# Patient Record
Sex: Male | Born: 1935 | Race: White | Hispanic: No | Marital: Married | Smoking: Former smoker
Health system: Southern US, Community
[De-identification: ages and names within clinical notes are randomized; demographics above are authoritative.]

## PROBLEM LIST (undated history)

## (undated) DIAGNOSIS — I10 Essential (primary) hypertension: Secondary | ICD-10-CM

---

## 2007-08-18 ENCOUNTER — Ambulatory Visit: Payer: Self-pay | Admitting: Gastroenterology

## 2011-10-21 ENCOUNTER — Ambulatory Visit: Payer: Self-pay | Admitting: Gastroenterology

## 2012-11-25 ENCOUNTER — Ambulatory Visit: Payer: Self-pay | Admitting: Orthopedic Surgery

## 2014-04-18 DIAGNOSIS — I10 Essential (primary) hypertension: Secondary | ICD-10-CM | POA: Insufficient documentation

## 2014-04-18 DIAGNOSIS — M5090 Cervical disc disorder, unspecified, unspecified cervical region: Secondary | ICD-10-CM | POA: Insufficient documentation

## 2014-04-18 DIAGNOSIS — E782 Mixed hyperlipidemia: Secondary | ICD-10-CM | POA: Insufficient documentation

## 2014-10-14 ENCOUNTER — Other Ambulatory Visit: Payer: Self-pay | Admitting: Orthopedic Surgery

## 2015-02-07 ENCOUNTER — Emergency Department
Admission: EM | Admit: 2015-02-07 | Discharge: 2015-02-07 | Disposition: A | Discharge status: Home / Self Care | Payer: Medicare Other | Attending: Emergency Medicine | Admitting: Emergency Medicine

## 2015-02-07 ENCOUNTER — Emergency Department: Payer: Medicare Other

## 2015-02-07 ENCOUNTER — Encounter: Payer: Self-pay | Admitting: Emergency Medicine

## 2015-02-07 DIAGNOSIS — I10 Essential (primary) hypertension: Secondary | ICD-10-CM | POA: Diagnosis not present

## 2015-02-07 DIAGNOSIS — R270 Ataxia, unspecified: Secondary | ICD-10-CM | POA: Diagnosis not present

## 2015-02-07 DIAGNOSIS — R531 Weakness: Secondary | ICD-10-CM | POA: Insufficient documentation

## 2015-02-07 DIAGNOSIS — R42 Dizziness and giddiness: Secondary | ICD-10-CM | POA: Diagnosis present

## 2015-02-07 HISTORY — DX: Essential (primary) hypertension: I10

## 2015-02-07 LAB — URINALYSIS COMPLETE WITH MICROSCOPIC (ARMC ONLY)
BACTERIA UA: NONE SEEN
BILIRUBIN URINE: NEGATIVE
GLUCOSE, UA: NEGATIVE mg/dL
HGB URINE DIPSTICK: NEGATIVE
Ketones, ur: NEGATIVE mg/dL
Leukocytes, UA: NEGATIVE
Nitrite: NEGATIVE
Protein, ur: NEGATIVE mg/dL
Specific Gravity, Urine: 1.009 (ref 1.005–1.030)
pH: 6 (ref 5.0–8.0)

## 2015-02-07 LAB — BASIC METABOLIC PANEL
ANION GAP: 9 (ref 5–15)
BUN: 14 mg/dL (ref 6–20)
CO2: 32 mmol/L (ref 22–32)
CREATININE: 1.23 mg/dL (ref 0.61–1.24)
Calcium: 10 mg/dL (ref 8.9–10.3)
Chloride: 89 mmol/L — ABNORMAL LOW (ref 101–111)
GFR calc Af Amer: >60 mL/min (ref >60)
GFR, EST NON AFRICAN AMERICAN: 54 mL/min — AB (ref >60)
GLUCOSE: 97 mg/dL (ref 65–99)
Potassium: 4.5 mmol/L (ref 3.5–5.1)
Sodium: 130 mmol/L — ABNORMAL LOW (ref 135–145)

## 2015-02-07 LAB — CBC
HEMATOCRIT: 46.1 % (ref 40.0–52.0)
Hemoglobin: 15.5 g/dL (ref 13.0–18.0)
MCH: 29.7 pg (ref 26.0–34.0)
MCHC: 33.7 g/dL (ref 32.0–36.0)
MCV: 88.1 fL (ref 80.0–100.0)
PLATELETS: 235 10*3/uL (ref 150–440)
RBC: 5.23 MIL/uL (ref 4.40–5.90)
RDW: 12.8 % (ref 11.5–14.5)
WBC: 6.5 10*3/uL (ref 3.8–10.6)

## 2015-02-07 MED ORDER — SODIUM CHLORIDE 0.9 % IV BOLUS (SEPSIS)
500.0000 mL | Freq: Once | INTRAVENOUS | Status: AC
Start: 2015-02-07 — End: 2015-02-07
  Administered 2015-02-07: 500 mL via INTRAVENOUS

## 2015-02-07 MED ORDER — IOHEXOL 350 MG/ML SOLN
80.0000 mL | Freq: Once | INTRAVENOUS | Status: AC | PRN
  Administered 2015-02-07: 80 mL via INTRAVENOUS

## 2015-02-07 MED ORDER — MECLIZINE HCL 25 MG PO TABS
50.0000 mg | ORAL_TABLET | Freq: Once | ORAL | Status: AC
  Administered 2015-02-07: 50 mg via ORAL
  Filled 2015-02-07: qty 2

## 2015-02-07 MED ORDER — MECLIZINE HCL 25 MG PO TABS: 25.0000 mg | ORAL_TABLET | Freq: Three times a day (TID) | ORAL | Status: DC | PRN

## 2015-02-07 NOTE — ED Notes (Signed)
Pt was able to ambulate with one stand by assist. Pt reports feeling weak but reports that dizziness has decreased. MD at bedside to observe pts ambulation. No acute distress noted and pt verbalized wanting to try meclizine at home. Pt verbalizes understanding of follow-up instructions.

## 2015-02-07 NOTE — ED Notes (Signed)
MD and RN at bedside to explain treatment plan to pt and pts wife. Pt verbalized understanding of plan.

## 2015-02-07 NOTE — ED Provider Notes (Signed)
[facility] Emergency Department Provider Note  ____________________________________________  Time seen: Approximately 12:05 PM  I have reviewed the triage vital signs and the nursing notes.   HISTORY  Chief Complaint Dizziness and Weakness    HPI Joel Sanders is a 81 y.o. male with a history of hypertension and hypercholesterolemia who is presenting today with diffuse weakness and ataxia. The patient says the symptoms started abruptly this past Thursday evening. He said that prior in the day he had blowing leaves and played golf. Since thenhe has been having worsening trouble with walking. Denies any focal weakness. Says that when he stands he has a funny feeling in his head but not saying that the room feels like it is spinning. He also says that he did have a flu shot this past Monday, 8 days ago. He denies a history of stroke but says that his parents have had strokes in the past. Denies weakness in the legs anymore so than the arms.   History reviewed. No pertinent past medical history.  There are no active problems to display for this patient.   History reviewed. No pertinent past surgical history.  No current outpatient prescriptions on file.  Allergies Review of patient's allergies indicates not on file.  No family history on file.  Social History Social History  Substance Use Topics  . Smoking status: Never Smoker   . Smokeless tobacco: None  . Alcohol Use: No    Review of Systems Constitutional: No fever/chills Eyes: No visual changes. ENT: No sore throat. Cardiovascular: Denies chest pain. Respiratory: Denies shortness of breath. Gastrointestinal: No abdominal pain.  No nausea, no vomiting.  No diarrhea.  No constipation. Genitourinary: Negative for dysuria. Musculoskeletal: Negative for back pain. Skin: Negative for rash. Neurological: Negative for headaches, focal weakness or numbness.  10-point ROS otherwise  negative.  ____________________________________________   PHYSICAL EXAM:  VITAL SIGNS: ED Triage Vitals  Enc Vitals Group     BP 02/07/15 1019 156/79 mmHg     Pulse Rate 02/07/15 1019 70     Resp 02/07/15 1019 16     Temp 02/07/15 1019 97.5 F (36.4 C)     Temp src --      SpO2 02/07/15 1019 97 %     Weight 02/07/15 1019 167 lb (75.751 kg)     Height 02/07/15 1019 5\' 10"  (1.778 m)     Head Cir --      Peak Flow --      Pain Score --      Pain Loc --      Pain Edu? --      Excl. in GC? --     Constitutional: Alert and oriented. Well appearing and in no acute distress. Eyes: Conjunctivae are normal. PERRL. EOMI, but with right going nystagmus when looks right. Head: Atraumatic. Nose: No congestion/rhinnorhea. Mouth/Throat: Mucous membranes are moist.  Oropharynx non-erythematous. Neck: No stridor.   Cardiovascular: Normal rate, regular rhythm. Grossly normal heart sounds.  Good peripheral circulation. Respiratory: Normal respiratory effort.  No retractions. Lungs CTAB. Gastrointestinal: Soft and nontender. No distention. No abdominal bruits. No CVA tenderness. Musculoskeletal: No lower extremity tenderness nor edema.  No joint effusions. Neurologic:  Normal speech and language. No gross focal neurologic deficits are appreciated. Patient with difficulty walking without assistance. Is able to take several steps but with a shuffling gait.  Mild right upper extremity ataxia on finger to nose testing.. Normal heel to shin testing. 2+ reflexes at the patellar tendons. Skin:  Skin is warm, dry and intact. No rash noted. Psychiatric: Mood and affect are normal. Speech and behavior are normal.  NIH Stroke Scale   1210 Person Administering Scale: Arelia Longest  Administer stroke scale items in the order listed. Record performance in each category after each subscale exam. Do not go back and change scores. Follow directions provided for each exam technique. Scores should  reflect what the patient does, not what the clinician thinks the patient can do. The clinician should record answers while administering the exam and work quickly. Except where indicated, the patient should not be coached (i.e., repeated requests to patient to make a special effort).   1a  Level of consciousness: 0=alert; keenly responsive  1b. LOC questions:  0=Performs both tasks correctly  1c. LOC commands: 0=Performs both tasks correctly  2.  Best Gaze: 0=normal  3.  Visual: 0=No visual loss  4. Facial Palsy: 0=Normal symmetric movement  5a.  Motor left arm: 0=No drift, limb holds 90 (or 45) degrees for full 10 seconds  5b.  Motor right arm: 0=No drift, limb holds 90 (or 45) degrees for full 10 seconds  6a. motor left leg: 0=No drift, limb holds 90 (or 45) degrees for full 10 seconds  6b  Motor right leg:  0=No drift, limb holds 90 (or 45) degrees for full 10 seconds  7. Limb Ataxia: 1=Present in one limb  8.  Sensory: 0=Normal; no sensory loss  9. Best Language:  0=No aphasia, normal  10. Dysarthria: 0=Normal  11. Extinction and Inattention: 0=No abnormality  12. Distal motor function: 0=Normal   Total:   1   ____________________________________________   LABS (all labs ordered are listed, but only abnormal results are displayed)  Labs Reviewed  BASIC METABOLIC PANEL - Abnormal; Notable for the following:    Sodium 130 (*)    Chloride 89 (*)    GFR calc non Af Amer 54 (*)    All other components within normal limits  URINALYSIS COMPLETEWITH MICROSCOPIC ([facility] ONLY) - Abnormal; Notable for the following:    Color, Urine YELLOW (*)    APPearance CLEAR (*)    Squamous Epithelial / LPF 0-5 (*)    All other components within normal limits  CBC  CBG MONITORING, ED   ____________________________________________  EKG  ED ECG REPORT I, Arelia Longest, the attending physician, personally viewed and interpreted this ECG.   Date: 02/07/2015  EKG Time: 1023  Rate: 71   Rhythm: normal EKG, normal sinus rhythm  Axis: Normal axis  Intervals:none  ST&T Change: No ST segment elevation or depression. No abnormal T-wave inversion.  ____________________________________________  RADIOLOGY  CAT scan with and without contrast of the head and neck without any evidence of acute intracranial abnormality. 45% proximal right and 55% proximal left ICA stenosis. ____________________________________________   PROCEDURES    ____________________________________________   INITIAL IMPRESSION / ASSESSMENT AND PLAN / ED COURSE  Pertinent labs & imaging results that were available during my care of the patient were reviewed by me and considered in my medical decision making (see chart for details).  ----------------------------------------- 4:15 PM on 02/07/2015 ----------------------------------------- Discussed case Dr. Loretha Brasil after the CT came back negative. Says that the nystagmus makes the case sound like a peripheral issue. We'll try meclizine.  ----------------------------------------- 5:35 PM on 02/07/2015 -----------------------------------------  Patient is resting comfortably at this time. After the meclizine is able to ambulate with a normal gait without any assistance. He says he still feels weak but  much improved. I discussed with him that because he has not improved that we could consider further workup including a lumbar puncture for Guillain-Barr syndrome. However, the patient says that he would like try the meclizine at home and will follow with Dr. Hyacinth Meeker in the office. I discussed extensively with him and his wife the return precautions to return to the emergency department for any worsening or concerning symptoms. Less likely this is caused by stroke, but not completely ruled out at this time. The patient is widowed understand this and are willing to comply with the plan.  ____________________________________________   FINAL CLINICAL  IMPRESSION(S) / ED DIAGNOSES  Final diagnoses:  Ataxia  Ataxia   Generalized weakness.   Myrna Blazer, MD 02/07/15 [phone]

## 2015-02-07 NOTE — ED Notes (Signed)
[facility] due to symptoms of weakness and dizziness. Pt was out of town last Thursday when pt developed symptoms of dizziness and weakness. Pt checked BP and reading was high. Pt contacted PCP at Dr. Garen LahMillers office and was advised to up the dosage. Today pt reports that the symptoms have not subsided.

## 2015-02-07 NOTE — ED Notes (Signed)
Pt states that he remembers on Friday evening when he woke up and turned on the TV, he noticed his vision was blurry and it took about a minute for the TV to focus for him. Pt denies blurred vision today.

## 2016-06-25 ENCOUNTER — Emergency Department
Admission: EM | Admit: 2016-06-25 | Discharge: 2016-06-25 | Disposition: A | Discharge status: Home / Self Care | Payer: Medicare Other | Attending: Emergency Medicine | Admitting: Emergency Medicine

## 2016-06-25 ENCOUNTER — Encounter: Payer: Self-pay | Admitting: Medical Oncology

## 2016-06-25 ENCOUNTER — Emergency Department: Payer: Medicare Other

## 2016-06-25 DIAGNOSIS — Y939 Activity, unspecified: Secondary | ICD-10-CM | POA: Diagnosis not present

## 2016-06-25 DIAGNOSIS — Y999 Unspecified external cause status: Secondary | ICD-10-CM | POA: Diagnosis not present

## 2016-06-25 DIAGNOSIS — I10 Essential (primary) hypertension: Secondary | ICD-10-CM | POA: Insufficient documentation

## 2016-06-25 DIAGNOSIS — W0110XA Fall on same level from slipping, tripping and stumbling with subsequent striking against unspecified object, initial encounter: Secondary | ICD-10-CM | POA: Diagnosis not present

## 2016-06-25 DIAGNOSIS — S0101XA Laceration without foreign body of scalp, initial encounter: Secondary | ICD-10-CM | POA: Insufficient documentation

## 2016-06-25 DIAGNOSIS — Z79899 Other long term (current) drug therapy: Secondary | ICD-10-CM | POA: Insufficient documentation

## 2016-06-25 DIAGNOSIS — Z23 Encounter for immunization: Secondary | ICD-10-CM | POA: Insufficient documentation

## 2016-06-25 DIAGNOSIS — S0990XA Unspecified injury of head, initial encounter: Secondary | ICD-10-CM | POA: Diagnosis present

## 2016-06-25 DIAGNOSIS — Y929 Unspecified place or not applicable: Secondary | ICD-10-CM | POA: Diagnosis not present

## 2016-06-25 DIAGNOSIS — T148XXA Other injury of unspecified body region, initial encounter: Secondary | ICD-10-CM

## 2016-06-25 MED ORDER — TETANUS-DIPHTH-ACELL PERTUSSIS 5-2.5-18.5 LF-MCG/0.5 IM SUSP
0.5000 mL | Freq: Once | INTRAMUSCULAR | Status: AC
  Administered 2016-06-25: 0.5 mL via INTRAMUSCULAR

## 2016-06-25 MED ORDER — TETANUS-DIPHTH-ACELL PERTUSSIS 5-2.5-18.5 LF-MCG/0.5 IM SUSP
INTRAMUSCULAR | Status: AC
  Administered 2016-06-25: 0.5 mL via INTRAMUSCULAR
  Filled 2016-06-25: qty 0.5

## 2016-06-25 NOTE — ED Provider Notes (Signed)
[facility] Emergency Department Provider Note  ____________________________________________   First MD Initiated Contact with Patient 06/25/16 [phone]     (approximate)  I have reviewed the triage vital signs and the nursing notes.   HISTORY  Chief Complaint Fall and Head Injury    HPI Joel Sanders is a 83 y.o. male who comes to the emergency department roughly 1 hour after mechanical slip and fall on the ice this morning falling backwards and striking the back of his head. He does report loss of consciousness. His wife said that for about 20 minutes he was acting abnormally. No seizure-like activity. Now is behaving normally. No numbness or weakness. He takes a baby aspirin a day but no other blood thinning medication. No chest pain or shortness of breath. No abdominal pain nausea or vomiting. He has an unknown last tetanus.He is currently in minimal pain and declines pain medication.   Past Medical History:  Diagnosis Date  . Hypertension     There are no active problems to display for this patient.   History reviewed. No pertinent surgical history.  Prior to Admission medications   Medication Sig Start Date End Date Taking? Authorizing Provider  lisinopril-hydrochlorothiazide (PRINZIDE,ZESTORETIC) 10-12.5 MG tablet Take 1 tablet by mouth daily.     Historical Provider, MD  meclizine (ANTIVERT) 25 MG tablet Take 1 tablet (25 mg total) by mouth 3 (three) times daily as needed for dizziness. 02/07/15   Myrna Blazer, MD  naproxen sodium (ANAPROX) 220 MG tablet Take 220 mg by mouth 2 (two) times daily as needed (for pain).    Historical Provider, MD  rosuvastatin (CRESTOR) 5 MG tablet Take 2.5 mg by mouth at bedtime.    Historical Provider, MD    Allergies Patient has no known allergies.  No family history on file.  Social History Social History  Substance Use Topics  . Smoking status: Never Smoker  . Smokeless tobacco: Not on file    . Alcohol use No    Review of Systems Constitutional: No fever/chills Eyes: No visual changes. ENT: No sore throat. Cardiovascular: Denies chest pain. Respiratory: Denies shortness of breath. Gastrointestinal: No abdominal pain.  No nausea, no vomiting.  No diarrhea.  No constipation. Genitourinary: Negative for dysuria. Musculoskeletal: Negative for back pain. Skin: Positive for wound Neurological: Positive for headaches, negative focal weakness or numbness.  10-point ROS otherwise negative.  ____________________________________________   PHYSICAL EXAM:  VITAL SIGNS: ED Triage Vitals  Enc Vitals Group     BP 06/25/16 0832 (!) 183/95     Pulse Rate 06/25/16 0832 81     Resp 06/25/16 0832 16     Temp 06/25/16 0834 97.7 F (36.5 C)     Temp Source 06/25/16 0834 Oral     SpO2 06/25/16 0832 97 %     Weight 06/25/16 0833 165 lb (74.8 kg)     Height 06/25/16 0833 5\' 10"  (1.778 m)     Head Circumference --      Peak Flow --      Pain Score --      Pain Loc --      Pain Edu? --      Excl. in GC? --     Constitutional: Alert and oriented x 4 well appearing nontoxic no diaphoresis speaks in full, clear sentences Eyes: PERRL EOMI. Head: Abrasion and skin tear to his occiput with no active bleeding. Nose: No congestion/rhinnorhea. Mouth/Throat: No trismus Neck: No stridor.   Cardiovascular: Normal  rate, regular rhythm. Grossly normal heart sounds.  Good peripheral circulation. Respiratory: Normal respiratory effort.  No retractions. Lungs CTAB and moving good air Gastrointestinal: Soft nondistended nontender no rebound no guarding no peritonitis no McBurney's tenderness negative Rovsing's no costovertebral tenderness negative Murphy's Musculoskeletal: No lower extremity edema   Neurologic:  Normal speech and language. No gross focal neurologic deficits are appreciated. Skin:  Skin is warm, dry and intact. No rash noted. Psychiatric: Mood and affect are normal. Speech and  behavior are normal.    ____________________________________________   DIFFERENTIAL  Subdural hematoma, intracerebral hemorrhage, laceration ____________________________________________   LABS (all labs ordered are listed, but only abnormal results are displayed)  Labs Reviewed - No data to display   __________________________________________  EKG   ____________________________________________  RADIOLOGY  CT negative for intracerebral hemorrhage ____________________________________________   PROCEDURES  Procedure(s) performed: no  Procedures  Critical Care performed: no  ____________________________________________   INITIAL IMPRESSION / ASSESSMENT AND PLAN / ED COURSE  Pertinent labs & imaging results that were available during my care of the patient were reviewed by me and considered in my medical decision making (see chart for details).  CT scan done given history of aspirin, significant fall, and advanced age. Fortunately the imaging is negative for acute pathology. Wound was washed out and dressed with Steri-Strips. He is medically stable for outpatient management understands and agrees the plan.      ____________________________________________   FINAL CLINICAL IMPRESSION(S) / ED DIAGNOSES  Final diagnoses:  Injury of head, initial encounter  Abrasion      NEW MEDICATIONS STARTED DURING THIS VISIT:  New Prescriptions   No medications on file     Note:  This document was prepared using Dragon voice recognition software and may include unintentional dictation errors.     Merrily BrittleNeil Jacory Kamel, MD 06/25/16 [phone]

## 2016-06-25 NOTE — ED Notes (Addendum)
Pt to ed with c/o falling on ice at 730 am. Pt states his feet slipped out from under him on the ice and he landed on his back, striking his head on the ground. Pt with mild bleeding from back of head. Pt states he did have a loss of consciousness during the fall.  Reports it was about 15-20 minutes before he was back to normal neuro status.  Pt denies use of blood thinners.  Pt alert and oriented and skin warm and dry.  Pupils equal and reactive to light.

## 2016-06-25 NOTE — Discharge Instructions (Addendum)
Please do not get your head wet for 24 hours.  Afterwards, it's a good idea to get it wet in the shower with regular water and soap to help keep it clean.  Please return to the ED for any new or worsening symptoms such as headache, numbness, weakness, or for any other concerns.  Otherwise follow up with your PMD as needed.  It was a pleasure to take care of you today, and thank you for coming to our emergency department.  If you have any questions or concerns before leaving please ask the nurse to grab me and I'm more than happy to go through your aftercare instructions again.  If you were prescribed any opioid pain medication today such as Norco, Vicodin, Percocet, morphine, hydrocodone, or oxycodone please make sure you do not drive when you are taking this medication as it can alter your ability to drive safely.  If you have any concerns once you are home that you are not improving or are in fact getting worse before you can make it to your follow-up appointment, please do not hesitate to call [phone] and come back for further evaluation.  Merrily BrittleNeil Zyire Eidson MD  Results for orders placed or performed during the hospital encounter of 02/07/15  Basic metabolic panel  Result Value Ref Range   Sodium 130 (L) 135 - 145 mmol/L   Potassium 4.5 3.5 - 5.1 mmol/L   Chloride 89 (L) 101 - 111 mmol/L   CO2 32 22 - 32 mmol/L   Glucose, Bld 97 65 - 99 mg/dL   BUN 14 6 - 20 mg/dL   Creatinine, Ser 4.541.23 0.61 - 1.24 mg/dL   Calcium 09.810.0 8.9 - 11.910.3 mg/dL   GFR calc non Af Amer 54 (L) >60 mL/min   GFR calc Af Amer >60 >60 mL/min   Anion gap 9 5 - 15  CBC  Result Value Ref Range   WBC 6.5 3.8 - 10.6 K/uL   RBC 5.23 4.40 - 5.90 MIL/uL   Hemoglobin 15.5 13.0 - 18.0 g/dL   HCT 14.746.1 82.940.0 - 56.252.0 %   MCV 88.1 80.0 - 100.0 fL   MCH 29.7 26.0 - 34.0 pg   MCHC 33.7 32.0 - 36.0 g/dL   RDW 13.012.8 86.511.5 - 78.414.5 %   Platelets 235 150 - 440 K/uL  Urinalysis complete, with microscopic ([facility] only)  Result Value Ref Range   Color, Urine YELLOW (A) YELLOW   APPearance CLEAR (A) CLEAR   Glucose, UA NEGATIVE NEGATIVE mg/dL   Bilirubin Urine NEGATIVE NEGATIVE   Ketones, ur NEGATIVE NEGATIVE mg/dL   Specific Gravity, Urine 1.009 1.005 - 1.030   Hgb urine dipstick NEGATIVE NEGATIVE   pH 6.0 5.0 - 8.0   Protein, ur NEGATIVE NEGATIVE mg/dL   Nitrite NEGATIVE NEGATIVE   Leukocytes, UA NEGATIVE NEGATIVE   RBC / HPF 0-5 0 - 5 RBC/hpf   WBC, UA 0-5 0 - 5 WBC/hpf   Bacteria, UA NONE SEEN NONE SEEN   Squamous Epithelial / LPF 0-5 (A) NONE SEEN   Hyaline Casts, UA PRESENT    Ct Head Wo Contrast  Result Date: 06/25/2016 CLINICAL DATA:  Fall, posterior head laceration EXAM: CT HEAD WITHOUT CONTRAST TECHNIQUE: Contiguous axial images were obtained from the base of the skull through the vertex without intravenous contrast. COMPARISON:  02/07/2015 FINDINGS: Brain: No evidence of acute infarction, hemorrhage, hydrocephalus, extra-axial collection or mass lesion/mass effect. Mild cortical atrophy. Subcortical white matter and periventricular small vessel ischemic changes. Vascular: Mild  intracranial atherosclerosis. Skull: Normal. Negative for fracture or focal lesion. Sinuses/Orbits: The visualized paranasal sinuses are essentially clear. The mastoid air cells are unopacified. Other: Mild soft tissue laceration along the right posterior vertex. IMPRESSION: Mild soft tissue laceration along the right posterior vertex. No evidence of calvarial fracture. No evidence of acute intracranial abnormality. Mild atrophy with small vessel ischemic changes. Electronically Signed   By: Charline Bills M.D.   On: 06/25/2016 09:02

## 2016-06-25 NOTE — ED Triage Notes (Signed)
Pt reports he slipped and fell on ice this am, hit the back of his head. Pt denies use of blood thinner. A/O with no deficits noted.

## 2016-07-19 DIAGNOSIS — Z85820 Personal history of malignant melanoma of skin: Secondary | ICD-10-CM | POA: Insufficient documentation

## 2016-11-26 ENCOUNTER — Other Ambulatory Visit: Payer: Self-pay | Admitting: Internal Medicine

## 2016-11-26 DIAGNOSIS — I6523 Occlusion and stenosis of bilateral carotid arteries: Secondary | ICD-10-CM

## 2016-11-26 DIAGNOSIS — R739 Hyperglycemia, unspecified: Secondary | ICD-10-CM

## 2016-11-29 ENCOUNTER — Ambulatory Visit
Admission: RE | Admit: 2016-11-29 | Discharge: 2016-11-29 | Disposition: A | Discharge status: Home / Self Care | Payer: Medicare Other | Source: Ambulatory Visit | Attending: Internal Medicine | Admitting: Internal Medicine

## 2016-11-29 DIAGNOSIS — I6523 Occlusion and stenosis of bilateral carotid arteries: Secondary | ICD-10-CM | POA: Diagnosis present

## 2016-11-29 DIAGNOSIS — R739 Hyperglycemia, unspecified: Secondary | ICD-10-CM

## 2016-12-02 DIAGNOSIS — I6522 Occlusion and stenosis of left carotid artery: Secondary | ICD-10-CM | POA: Insufficient documentation

## 2017-10-08 ENCOUNTER — Other Ambulatory Visit: Payer: Self-pay | Admitting: Internal Medicine

## 2017-10-08 ENCOUNTER — Ambulatory Visit
Admission: RE | Admit: 2017-10-08 | Discharge: 2017-10-08 | Disposition: A | Discharge status: Home / Self Care | Payer: Medicare Other | Source: Ambulatory Visit | Attending: Internal Medicine | Admitting: Internal Medicine

## 2017-10-08 DIAGNOSIS — R0602 Shortness of breath: Secondary | ICD-10-CM

## 2017-10-08 DIAGNOSIS — I700 Atherosclerosis of aorta: Secondary | ICD-10-CM | POA: Insufficient documentation

## 2017-10-08 MED ORDER — IOHEXOL 350 MG/ML SOLN
75.0000 mL | Freq: Once | INTRAVENOUS | Status: AC | PRN
  Administered 2017-10-08: 75 mL via INTRAVENOUS

## 2017-10-20 DIAGNOSIS — E119 Type 2 diabetes mellitus without complications: Secondary | ICD-10-CM | POA: Insufficient documentation

## 2017-10-20 DIAGNOSIS — I700 Atherosclerosis of aorta: Secondary | ICD-10-CM | POA: Insufficient documentation

## 2017-10-20 DIAGNOSIS — Z0000 Encounter for general adult medical examination without abnormal findings: Secondary | ICD-10-CM | POA: Insufficient documentation

## 2017-11-03 NOTE — Progress Notes (Signed)
[facility]ation      Assessment and Plan:  Intractable cough with chronic bronchitis. Mucus impaction in left lower lobe, likely related to above. - Continue Advair, was increased to Advair 250 twice daily, continue Singulair. - We will start Tessalon 3 times daily and guaifenesin/dextromethorphan 3 times daily - Asked to use flutter valve 3 times daily to help with mucus clearance. - Patient was provided reassurance that his cough should resolve over the next 1 month, in the meantime we will treat this symptomatically as above.  He is asked to follow-up with us in 1 month if symptoms not resolved, at which point he may require further work-up which may include a bronchoscopy. - In regards to the mucus impaction, would recommend repeat CT chest in 3 to 6 months to ensure resolution.   Meds ordered this encounter  Medications  . benzonatate (TESSALON PERLES) 100 MG capsule    Sig: Take 2 capsules (200 mg total) by mouth 3 (three) times daily.    Dispense:  90 capsule    Refill:  2  . Fluticasone-Salmeterol (WIXELA INHUB) 250-50 MCG/DOSE AEPB    Sig: Inhale 1 puff into the lungs 2 (two) times daily. Rinse mouth after use.    Dispense:  60 each    Refill:  1  . montelukast (SINGULAIR) 10 MG tablet    Sig: Take 1 tablet (10 mg total) by mouth at bedtime.    Dispense:  30 tablet    Refill:  1  . AMBULATORY NON FORMULARY MEDICATION    Sig: Medication Name: Please dispense flutter valve to use 3 times daily. 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    Date: 11/04/2017  MRN# [id number] Joel Sanders Chaddock 1933/10/29    Joel Sanders Woldt is a 84 y.o. old male seen in consultation for chief complaint of:    Chief Complaint  Patient presents with  . Consult    Referred by Dr. Bethann PunchesMark Miller for cough and changes on CT scan.  . Cough    greenish...pt was started on Advair about 1 month ago. Patient has not seen  a difference.    HPI:  His issues started at the end of May with a cough, with weakness. The cough seems to come and go. His father died of lung cancer and it makes him worried.  The cough has persisted for about 2 months, the cough occurs in paroxysms.  [place] and they had ground water in the ductwork. The cough seems to get worse when they go to the beach house.  He has been given zpack, and another abx, prednisone. He has taken robitussin and not sure if it helped. He has reflux which helps with his symptoms, but he seems to cough worse after eating.  He has no pets. Denies sinus drainage.   He is taking advair and singulair which are about to run out. He was taken off of ACEi recently.   CT chest 10/08/2017>> there is a possible mucus impaction in the left lower lobe subsegmental bronchus.   PMHX:   Past Medical History:  Diagnosis Date  . Hypertension    Surgical Hx:  History reviewed. No pertinent surgical history. Family Hx:  Family History  Problem Relation Age of Onset  . Cancer Mother   . Hypertension Mother   . Lung cancer Father  Social Hx:   Social History   Tobacco Use  . Smoking status: Former Smoker    Packs/day: 1.00    Years: 15.00    Pack years: 15.00    Last attempt to quit: 04/16/1965    Years since quitting: 52.5  . Smokeless tobacco: Never Used  Substance Use Topics  . Alcohol use: No  . Drug use: No   Medication:    Current Outpatient Medications:  .  amLODipine (NORVASC) 5 MG tablet, Take 5 mg by mouth at bedtime., Disp: , Rfl:  .  aspirin EC 81 MG tablet, Take 81 mg by mouth daily., Disp: , Rfl:  .  finasteride (PROSCAR) 5 MG tablet, Take 5 mg by mouth daily., Disp: , Rfl: 3 .  ipratropium (ATROVENT) 0.06 % nasal spray, Place 2 sprays into the nose 3 (three) times daily., Disp: , Rfl:  .  montelukast (SINGULAIR) 10 MG tablet, Take 10 mg by mouth at bedtime., Disp: , Rfl:  .  Multiple Vitamin (MULTI-VITAMINS)  TABS, Take 1 tablet by mouth daily., Disp: , Rfl:  .  naproxen sodium (ANAPROX) 220 MG tablet, Take 220 mg by mouth 2 (two) times daily as needed (for pain)., Disp: , Rfl:  .  pantoprazole (PROTONIX) 40 MG tablet, Take 40 mg by mouth daily., Disp: , Rfl:  .  rosuvastatin (CRESTOR) 5 MG tablet, Take 2.5 mg by mouth at bedtime., Disp: , Rfl:  .  WIXELA INHUB 100-50 MCG/DOSE AEPB, Inhale 1 puff into the lungs 2 (two) times daily., Disp: , Rfl: 0   Allergies:  Influenza a (h1n1) monovalent vaccine live  Review of Systems: Gen:  Denies  fever, sweats, chills HEENT: Denies blurred vision, double vision. bleeds, sore throat Cvc:  No dizziness, chest pain. Resp:   Denies cough or sputum production, shortness of breath Gi: Denies swallowing difficulty, stomach pain. Gu:  Denies bladder incontinence, burning urine Ext:   No Joint pain, stiffness. Skin: No skin rash,  hives  Endoc:  No polyuria, polydipsia. Psych: No depression, insomnia. Other:  All other systems were reviewed with the patient and were negative other that what is mentioned in the HPI.   Physical Examination:   VS: BP 130/90 (BP Location: Left Arm, Cuff Size: Normal)   Pulse 77   Resp 16   Ht 5\' 10"  (1.778 m)   Wt 159 lb (72.1 kg)   SpO2 96%   BMI 22.81 kg/m   General Appearance: No distress  Neuro:without focal findings,  speech normal,  HEENT: PERRLA, EOM intact.   Pulmonary: normal breath sounds, No wheezing.  CardiovascularNormal S1,S2.  No m/r/g.   Abdomen: Benign, Soft, non-tender. Renal:  No costovertebral tenderness  GU:  No performed at this time. Endoc: No evident thyromegaly, no signs of acromegaly. Skin:   warm, no rashes, no ecchymosis  Extremities: normal, no cyanosis, clubbing.  Other findings:    LABORATORY PANEL:   CBC No results for input(s): WBC, HGB, HCT, PLT in the last 168  hours. ------------------------------------------------------------------------------------------------------------------  Chemistries  No results for input(s): NA, K, CL, CO2, GLUCOSE, BUN, CREATININE, CALCIUM, MG, AST, ALT, ALKPHOS, BILITOT in the last 168 hours.  Invalid input(s): GFRCGP ------------------------------------------------------------------------------------------------------------------  Cardiac Enzymes No results for input(s): TROPONINI in the last 168 hours. ------------------------------------------------------------  RADIOLOGY:  No results found.     Thank  you for the consultation and for allowing [facility] to assist in the care of your patient. Our recommendations are noted above.  Please  contact us if we can be of further service.   Wells Guiles, M.D., F.C.C.P.  Board Certified in Internal Medicine, Pulmonary Medicine, Critical Care Medicine, and Sleep Medicine.  [facility] Office Number: [phone]   11/04/2017

## 2017-11-04 ENCOUNTER — Encounter: Payer: Self-pay | Admitting: Internal Medicine

## 2017-11-04 ENCOUNTER — Other Ambulatory Visit: Payer: Self-pay | Admitting: Internal Medicine

## 2017-11-04 ENCOUNTER — Ambulatory Visit (INDEPENDENT_AMBULATORY_CARE_PROVIDER_SITE_OTHER): Payer: Medicare Other | Admitting: Internal Medicine

## 2017-11-04 VITALS — BP 130/90 | HR 77 | Resp 16 | Ht 70.0 in | Wt 159.0 lb

## 2017-11-04 DIAGNOSIS — J42 Unspecified chronic bronchitis: Secondary | ICD-10-CM | POA: Diagnosis not present

## 2017-11-04 MED ORDER — FLUTICASONE-SALMETEROL 250-50 MCG/DOSE IN AEPB: 1.0000 | INHALATION_SPRAY | Freq: Two times a day (BID) | RESPIRATORY_TRACT | 1 refills | Status: AC

## 2017-11-04 MED ORDER — AMBULATORY NON FORMULARY MEDICATION: 0 refills | Status: AC

## 2017-11-04 MED ORDER — BENZONATATE 100 MG PO CAPS: 200.0000 mg | ORAL_CAPSULE | Freq: Three times a day (TID) | ORAL | 2 refills | Status: AC

## 2017-11-04 MED ORDER — MONTELUKAST SODIUM 10 MG PO TABS: 10.0000 mg | ORAL_TABLET | Freq: Every day | ORAL | 1 refills | Status: DC

## 2017-11-04 NOTE — Patient Instructions (Signed)
Take Delsym -DM or Mucinex-DM or similar three times daily.  Take tessalon (benzonatate) pills three times daily.  Use flutter valve 3 times daily.  Continue advair and singulair.   Call back in 1 month if cough is persistent for follow-up.

## 2017-11-05 ENCOUNTER — Telehealth: Payer: Self-pay | Admitting: Internal Medicine

## 2017-11-05 NOTE — Telephone Encounter (Signed)
Patient shown how to use flutter valve. Nothing further needed.

## 2017-11-05 NOTE — Telephone Encounter (Signed)
Patient in lobby  Would like to discuss with nurse about use of flutter valve Please advise

## 2020-06-14 ENCOUNTER — Other Ambulatory Visit: Payer: Self-pay | Admitting: Internal Medicine

## 2020-06-14 DIAGNOSIS — I6522 Occlusion and stenosis of left carotid artery: Secondary | ICD-10-CM

## 2020-06-14 DIAGNOSIS — G459 Transient cerebral ischemic attack, unspecified: Secondary | ICD-10-CM

## 2020-06-30 ENCOUNTER — Inpatient Hospital Stay: Admission: RE | Admit: 2020-06-30 | Payer: Medicare Other | Source: Ambulatory Visit

## 2020-06-30 ENCOUNTER — Ambulatory Visit
Admission: RE | Admit: 2020-06-30 | Discharge: 2020-06-30 | Disposition: A | Discharge status: Home / Self Care | Payer: Medicare Other | Source: Ambulatory Visit | Attending: Internal Medicine | Admitting: Internal Medicine

## 2020-06-30 ENCOUNTER — Other Ambulatory Visit: Payer: Self-pay

## 2020-06-30 DIAGNOSIS — G459 Transient cerebral ischemic attack, unspecified: Secondary | ICD-10-CM | POA: Insufficient documentation

## 2020-06-30 DIAGNOSIS — I6522 Occlusion and stenosis of left carotid artery: Secondary | ICD-10-CM | POA: Insufficient documentation

## 2020-06-30 MED ORDER — IOHEXOL 350 MG/ML SOLN
75.0000 mL | Freq: Once | INTRAVENOUS | Status: AC | PRN
  Administered 2020-06-30: 75 mL via INTRAVENOUS

## 2021-02-19 ENCOUNTER — Other Ambulatory Visit: Payer: Self-pay | Admitting: Physical Medicine and Rehabilitation

## 2021-02-19 ENCOUNTER — Other Ambulatory Visit (HOSPITAL_COMMUNITY): Payer: Self-pay | Admitting: Physical Medicine and Rehabilitation

## 2021-02-19 DIAGNOSIS — M5412 Radiculopathy, cervical region: Secondary | ICD-10-CM

## 2021-02-27 ENCOUNTER — Other Ambulatory Visit: Payer: Self-pay

## 2021-02-27 ENCOUNTER — Other Ambulatory Visit: Payer: Self-pay | Admitting: Physical Medicine and Rehabilitation

## 2021-02-27 ENCOUNTER — Ambulatory Visit
Admission: RE | Admit: 2021-02-27 | Discharge: 2021-02-27 | Disposition: A | Discharge status: Home / Self Care | Payer: Medicare Other | Source: Ambulatory Visit | Attending: Physical Medicine and Rehabilitation | Admitting: Physical Medicine and Rehabilitation

## 2021-02-27 DIAGNOSIS — M5412 Radiculopathy, cervical region: Secondary | ICD-10-CM | POA: Insufficient documentation

## 2022-09-22 IMAGING — MR MR CERVICAL SPINE W/O CM
5 series · 35 of 48 positions shown · non-contrast
Comparison: CTA head and neck 06/30/2020.

CLINICAL DATA: 87-year-old male with persistent neck pain. New
right hand numbness for 3 weeks.

EXAM:
MRI CERVICAL SPINE WITHOUT CONTRAST
TECHNIQUE: Multiplanar, multisequence MR imaging of the cervical spine was
performed. No intravenous contrast was administered.

[Series 5: T2 · sagittal · 3.0mm · 0.62mm/px · 6 of 15 slices shown (1 of 2)]
[im 1/15]
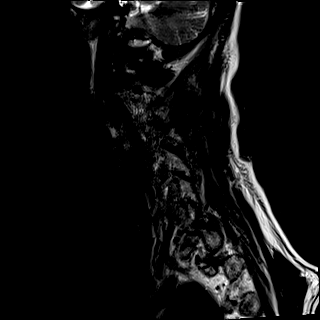
[im 3/15]
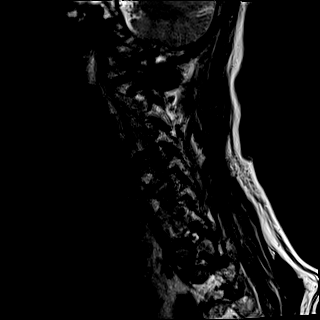
[im 6/15]
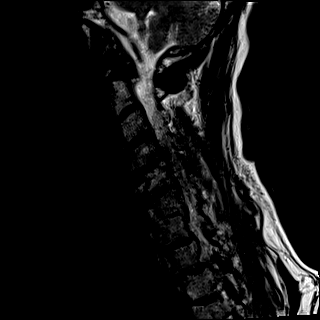
[im 9/15]
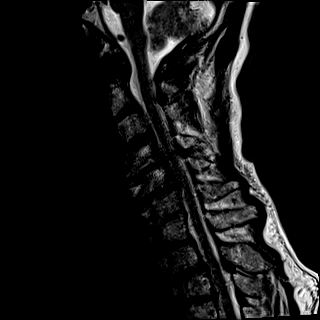
[im 12/15]
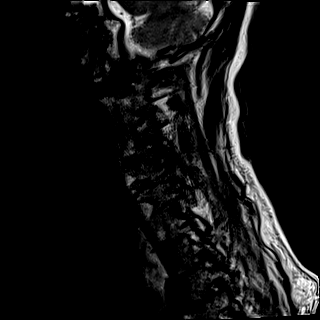
[im 15/15]
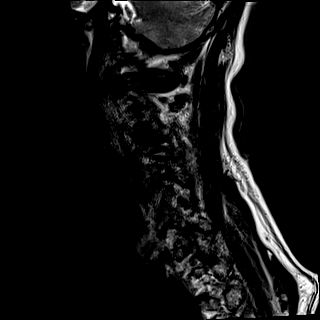

[Series 6: FLAIR · sagittal · 3.0mm · 0.78mm/px · 7 of 15 slices shown]
[im 1/15]
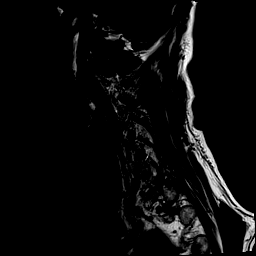
[im 3/15]
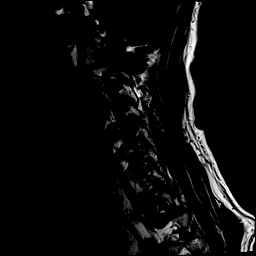
[im 5/15]
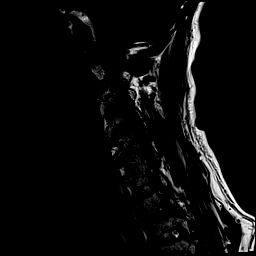
[im 8/15]
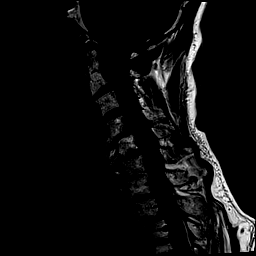
[im 10/15]
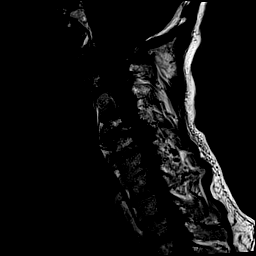
[im 12/15]
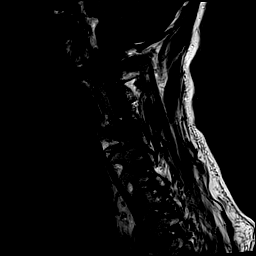
[im 15/15]
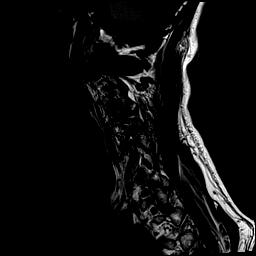

[Series 7: STIR · sagittal · 3.0mm · 0.62mm/px · 7 of 15 slices shown]
[im 1/15]
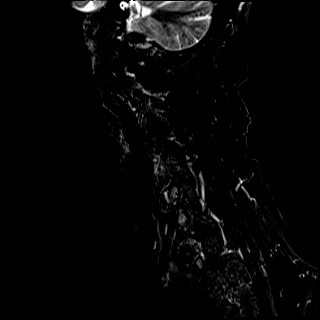
[im 3/15]
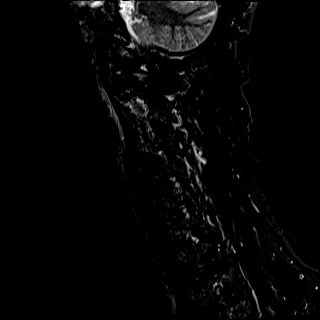
[im 5/15]
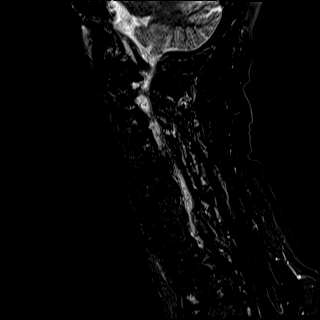
[im 8/15]
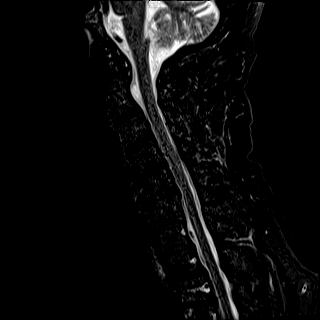
[im 10/15]
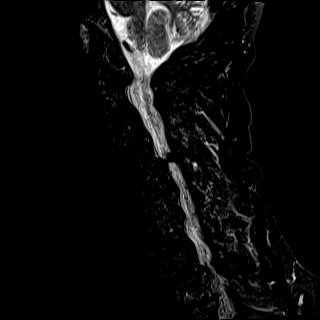
[im 12/15]
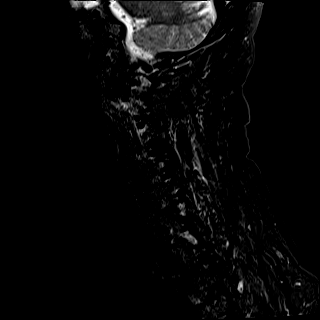
[im 15/15]
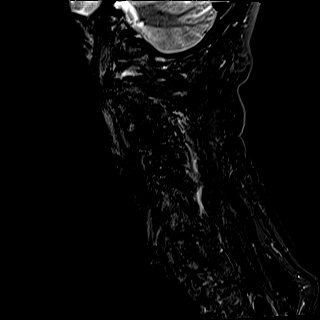

[Series 8: T2 · axial · 3.0mm · 0.70mm/px · z∈[-123,-30]mm · 8 of 29 slices shown (2 of 2)]
[im 1/29]
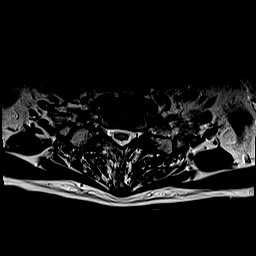
[im 5/29]
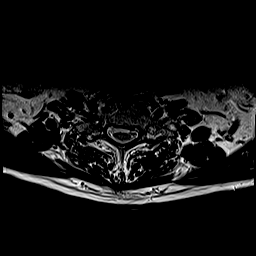
[im 9/29]
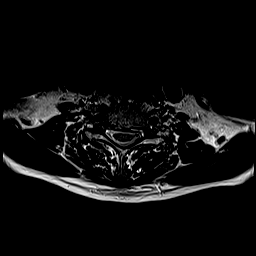
[im 13/29]
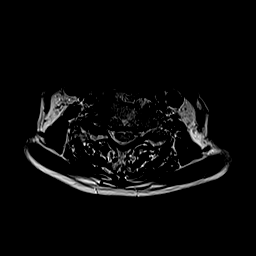
[im 16/29]
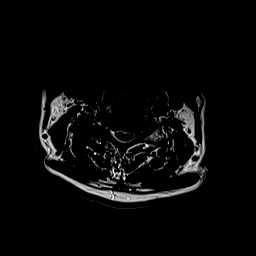
[im 20/29]
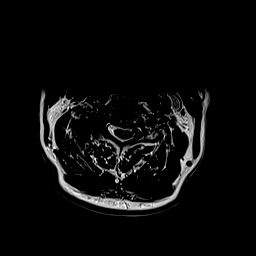
[im 24/29]
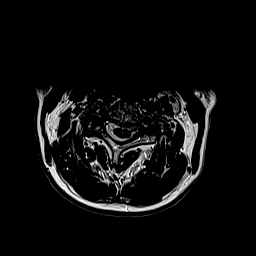
[im 29/29]
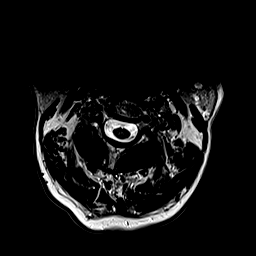

[Series 9: ax mpgr · axial · 3.0mm · 0.35mm/px · z∈[-123,-47]mm · 7 of 29 slices shown]
[im 1/29]
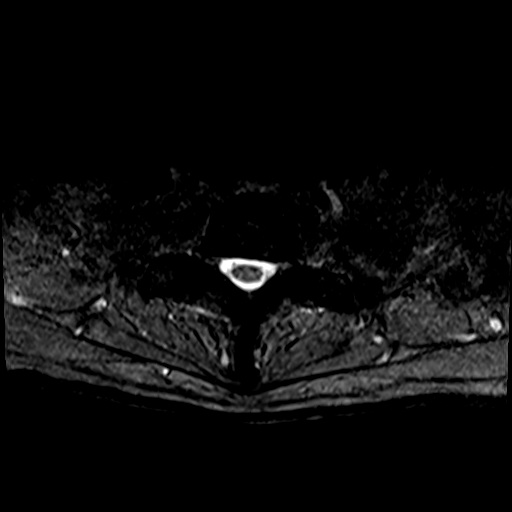
[im 5/29]
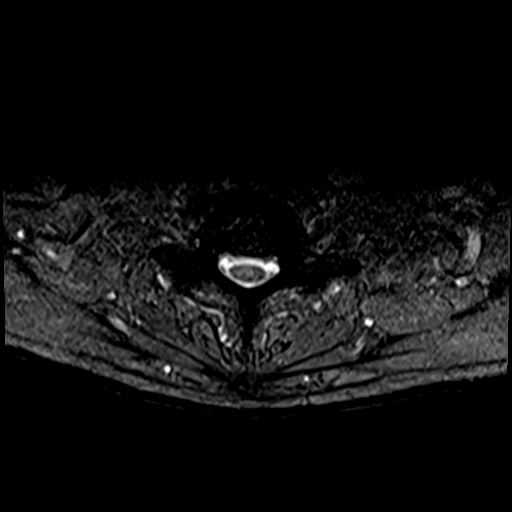
[im 9/29]
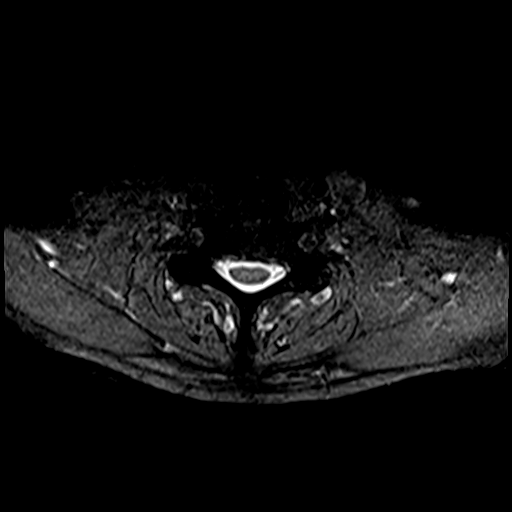
[im 13/29]
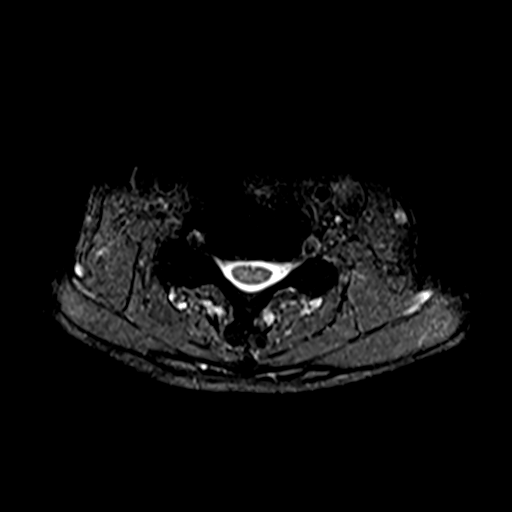
[im 16/29]
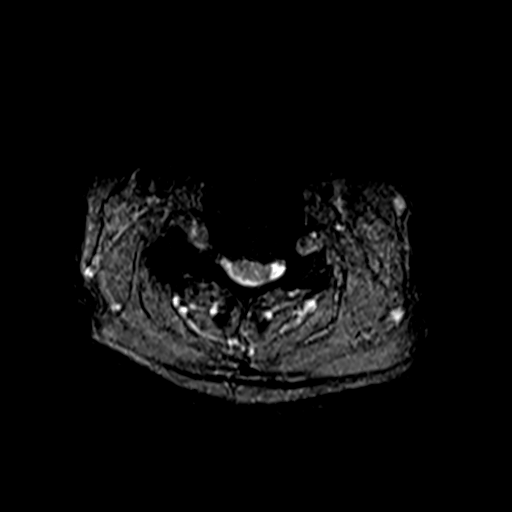
[im 20/29]
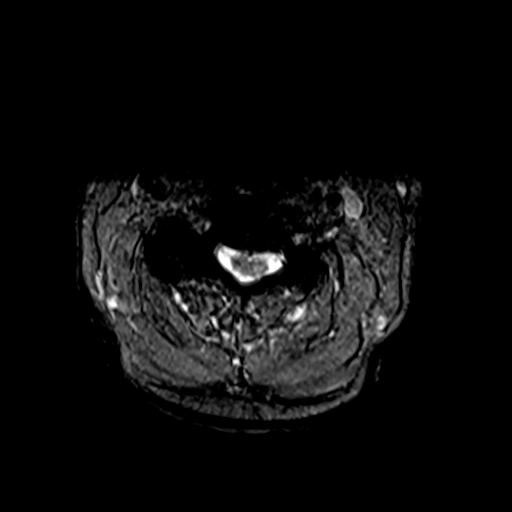
[im 24/29]
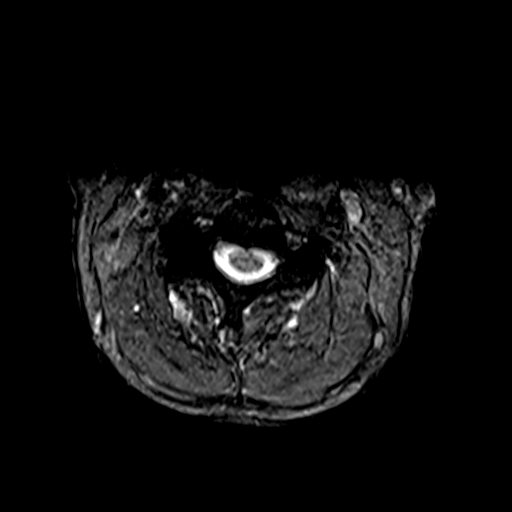

[35 of 48 positions shown; findings below may reference images not displayed]

FINDINGS: Alignment: Straightening and mild reversal of cervical lordosis is
stable from the [REDACTED] CTA. There is mild anterolisthesis at the
cervicothoracic junction and T1-T2.

Vertebrae: Right side and posterior element C2 and C3 ankylosis
better demonstrated by CTA. Heterogeneous, degenerative marrow
signal in the cervical spine especially C4 and C5. Superimposed
C5-C6 ankylosis also. No marrow edema or evidence of acute osseous
abnormality.

Cord: No spinal cord signal abnormality despite some degenerative
cord mass effect detailed below.

Posterior Fossa, vertebral arteries, paraspinal tissues:
Cervicomedullary junction is within normal limits. Negative visible
posterior fossa. Preserved major vascular flow voids in the neck.
Negative visible neck soft tissues, lung apices.

Disc levels:

C1-C2: Severe right side joint space loss with possible developing
ankylosis. Bulky ligamentous hypertrophy. But no significant
stenosis.

C2-C3: Ankylosis greater on the right side. Mild facet and ligament
flavum hypertrophy. No stenosis.

C3-C4: Severe right facet hypertrophy. Mild foraminal disc bulging
and endplate spurring. No spinal stenosis. Moderate to severe right
C4 foraminal stenosis.

C4-C5: Disc space loss. Bulky circumferential disc osteophyte
complex. Ligament flavum hypertrophy and moderate to severe right
facet hypertrophy. Mild spinal stenosis and mild cord mass effect.
Mild to moderate left and moderate to severe right C5 foraminal
stenosis.

C5-C6: Disc space loss and bulky circumferential disc osteophyte
complex, although mostly anteriorly. Mild spinal stenosis and mild
cord mass effect. Mild facet hypertrophy greater on the right. Mild
left and moderate to severe right C6 foraminal stenosis.

C6-C7: Interbody ankylosis with endplate spurring. No spinal
stenosis. Mild bilateral C7 foraminal stenosis.

C7-T1: Mild anterolisthesis. Mild disc bulging. Mild to moderate
facet and ligament flavum hypertrophy. No spinal stenosis. Mild to
moderate left C8 foraminal stenosis.

T1-T2: Anterolisthesis with disc space loss, facet and ligament
flavum hypertrophy. Mild spinal stenosis. Moderate to severe
bilateral T1 foraminal stenosis.

T2-T3 severe disc space loss and posterior disc bulging but no
significant spinal stenosis.
IMPRESSION: 1. Advanced chronic cervical spine degeneration.
Chronic ankylosis of C2-C3, C6-C7, and probable developing ankylosis
also at C1-C2 on the right.
Bulky disc, endplate, and facet arthropathy.
No acute osseous abnormality.

2. Multifactorial mild spinal stenosis at C4-C5 and C5-C6 with mild
cord mass effect, but no cord signal abnormality. Moderate to severe
neural foraminal stenosis at the right C4, C5, C6, and bilateral
nerve levels.

3. T1-T2 mild spinal stenosis.

## 2024-05-12 ENCOUNTER — Ambulatory Visit
Admission: RE | Admit: 2024-05-12 | Discharge: 2024-05-12 | Disposition: A | Discharge status: Home / Self Care | Source: Ambulatory Visit | Attending: Internal Medicine | Admitting: Internal Medicine

## 2024-05-12 ENCOUNTER — Other Ambulatory Visit: Payer: Self-pay | Admitting: Internal Medicine

## 2024-05-12 ENCOUNTER — Ambulatory Visit
Admission: RE | Admit: 2024-05-12 | Discharge: 2024-05-12 | Disposition: A | Source: Ambulatory Visit | Attending: Internal Medicine | Admitting: Internal Medicine

## 2024-05-12 DIAGNOSIS — G119 Hereditary ataxia, unspecified: Secondary | ICD-10-CM
